# Patient Record
Sex: Male | Born: 1960 | Hispanic: Refuse to answer | Marital: Married | Smoking: Never smoker
Health system: Southern US, Community
[De-identification: ages and names within clinical notes are randomized; demographics above are authoritative.]

---

## 2012-07-27 ENCOUNTER — Ambulatory Visit (INDEPENDENT_AMBULATORY_CARE_PROVIDER_SITE_OTHER): Payer: BC Managed Care – PPO | Admitting: Physician Assistant

## 2012-07-27 VITALS — BP 144/86 | HR 80 | Temp 98.4°F | Resp 16 | Ht 69.5 in | Wt 212.2 lb

## 2012-07-27 DIAGNOSIS — J069 Acute upper respiratory infection, unspecified: Secondary | ICD-10-CM

## 2012-07-27 MED ORDER — GUAIFENESIN ER 1200 MG PO TB12: 1.0000 | ORAL_TABLET | Freq: Two times a day (BID) | ORAL | Status: DC | PRN

## 2012-07-27 MED ORDER — IPRATROPIUM BROMIDE 0.03 % NA SOLN: 2.0000 | Freq: Two times a day (BID) | NASAL | Status: DC

## 2012-07-27 NOTE — Patient Instructions (Addendum)
Get plenty of rest and drink at least 64 ounces of water daily. 

## 2012-07-27 NOTE — Progress Notes (Signed)
  Subjective:    Patient ID: Barnes Florek, male    DOB: 1960-09-28, 51 y.o.   MRN: [id number]  HPI This 51 y.o. male presents for evaluation of illness.  Sore throat began 8 days ago, with laryngitis.  Then developed cough, which is now gone.  Mild sneezing.  Today began to feel a little feverish.    History reviewed. No pertinent past medical history.  History reviewed. No pertinent past surgical history.  Prior to Admission medications   Medication Sig Start Date End Date Taking? Authorizing Provider  b complex vitamins tablet Take 1 tablet by mouth daily.   Yes Historical Provider, MD  Multiple Vitamin (MULTIVITAMIN) tablet Take 1 tablet by mouth daily.   Yes Historical Provider, MD  VITAMIN E PO Take 1 tablet by mouth daily.   Yes Historical Provider, MD    No Known Allergies  History   Social History  . Marital Status: Married    Spouse Name: N/A    Number of Children: 2  . Years of Education: N/A   Occupational History  . [employer]    Social History Main Topics  . Smoking status: Never Smoker   . Smokeless tobacco: Never Used  . Alcohol Use: No  . Drug Use: No  . Sexually Active: Yes -- Male partner(s)   Other Topics Concern  . Not on file   Social History Narrative   From [place]. Degree in Engineering for [employer].    Family History  Problem Relation Age of Onset  . Diabetes Mother   . Diabetes Father   . Diabetes Paternal Grandmother   . Diabetes Paternal Grandfather     Review of Systems     Objective:   Physical Exam        Assessment & Plan:

## 2012-07-27 NOTE — Progress Notes (Signed)
  Subjective:    Patient ID: Joe Frey, male    DOB: 1961-06-06, 51 y.o.   MRN: [id number]  HPI This 51 y.o. male presents for evaluation of illness.  Symptoms began 8 days ago with sorethroat and hoarse voice.  The sore throat resolved and he developed cough and runny nose.  No ear pain, HA, GI/GU symptoms.  No fever/chills.  He feels much better, and almost all his symptoms are resolved, just a little residual runny nose, but today developed diaphoresis x 2 episodes.     History reviewed. No pertinent past medical history.  History reviewed. No pertinent past surgical history.  Prior to Admission medications   Medication Sig Start Date End Date Taking? Authorizing Provider  b complex vitamins tablet Take 1 tablet by mouth daily.   Yes Historical Provider, MD  Guaifenesin (MUCINEX MAXIMUM STRENGTH) 1200 MG TB12 Take 1 tablet (1,200 mg total) by mouth every 12 (twelve) hours as needed. 07/27/12   Tomasa Dobransky S Jerrelle Michelsen, PA-C  ipratropium (ATROVENT) 0.03 % nasal spray Place 2 sprays into the nose 2 (two) times daily. 07/27/12   Michelle Vanhise Tessa Lerner, PA-C  Multiple Vitamin (MULTIVITAMIN) tablet Take 1 tablet by mouth daily.   Yes Historical Provider, MD  VITAMIN E PO Take 1 tablet by mouth daily.   Yes Historical Provider, MD    No Known Allergies  History   Social History  . Marital Status: Married    Spouse Name: N/A    Number of Children: 2  . Years of Education: N/A   Occupational History  . [employer]    Social History Main Topics  . Smoking status: Never Smoker   . Smokeless tobacco: Never Used  . Alcohol Use: No  . Drug Use: No  . Sexually Active: Yes -- Male partner(s)   Social History Narrative   From [place]. Degree in Engineering for [employer]. He is one of 13 siblings.    Family History  Problem Relation Age of Onset  . Diabetes Mother   . Diabetes Father   . Diabetes Paternal Grandmother   . Diabetes Paternal Grandfather     Review of  Systems As above.    Objective:   Physical Exam  Blood pressure 144/86, pulse 80, temperature 98.4 F (36.9 C), temperature source Oral, resp. rate 16, height 5' 9.5" (1.765 m), weight 212 lb 3.2 oz (96.253 kg). Body mass index is 30.89 kg/(m^2). Well-developed, well nourished [place] man who is awake, alert and oriented, in NAD. HEENT: [place]/AT, PERRL, EOMI.  Sclera and conjunctiva are clear.  EAC are patent, TMs are normal in appearance. Nasal mucosa is pink and moist. OP is clear. Neck: supple, non-tender, no lymphadenopathy, thyromegaly. Heart: RRR, no murmur Lungs: normal effort, CTA Extremities: no cyanosis, clubbing or edema. Skin: warm and dry without rash. Psychologic: good mood and appropriate affect, normal speech and behavior.     Assessment & Plan:   1. Acute upper respiratory infections of unspecified site  ipratropium (ATROVENT) 0.03 % nasal spray, Guaifenesin (MUCINEX MAXIMUM STRENGTH) 1200 MG TB12   Supportive care, anticipatory guidance.

## 2019-01-09 ENCOUNTER — Encounter (HOSPITAL_COMMUNITY): Payer: Self-pay | Admitting: Emergency Medicine

## 2019-01-09 ENCOUNTER — Emergency Department (HOSPITAL_COMMUNITY)
Admission: EM | Admit: 2019-01-09 | Discharge: 2019-01-09 | Disposition: A | Discharge status: Home / Self Care | Payer: BLUE CROSS/BLUE SHIELD | Attending: Emergency Medicine | Admitting: Emergency Medicine

## 2019-01-09 ENCOUNTER — Other Ambulatory Visit: Payer: Self-pay

## 2019-01-09 ENCOUNTER — Emergency Department (HOSPITAL_COMMUNITY): Payer: BLUE CROSS/BLUE SHIELD

## 2019-01-09 DIAGNOSIS — Z7982 Long term (current) use of aspirin: Secondary | ICD-10-CM | POA: Diagnosis not present

## 2019-01-09 DIAGNOSIS — R079 Chest pain, unspecified: Secondary | ICD-10-CM | POA: Diagnosis present

## 2019-01-09 LAB — BASIC METABOLIC PANEL
Anion gap: 13 (ref 5–15)
BUN: 15 mg/dL (ref 6–20)
CO2: 23 mmol/L (ref 22–32)
Calcium: 9.7 mg/dL (ref 8.9–10.3)
Chloride: 104 mmol/L (ref 98–111)
Creatinine, Ser: 0.94 mg/dL (ref 0.61–1.24)
GFR calc Af Amer: >60 mL/min (ref >60)
GFR calc non Af Amer: >60 mL/min (ref >60)
Glucose, Bld: 216 mg/dL — ABNORMAL HIGH (ref 70–99)
Potassium: 4.3 mmol/L (ref 3.5–5.1)
Sodium: 140 mmol/L (ref 135–145)

## 2019-01-09 LAB — CBC
HCT: 43.5 % (ref 39.0–52.0)
Hemoglobin: 14.2 g/dL (ref 13.0–17.0)
MCH: 28.3 pg (ref 26.0–34.0)
MCHC: 32.6 g/dL (ref 30.0–36.0)
MCV: 86.7 fL (ref 80.0–100.0)
Platelets: 267 10*3/uL (ref 150–400)
RBC: 5.02 MIL/uL (ref 4.22–5.81)
RDW: 12.8 % (ref 11.5–15.5)
WBC: 4.9 10*3/uL (ref 4.0–10.5)
nRBC: 0 % (ref 0.0–0.2)

## 2019-01-09 LAB — TROPONIN I
Troponin I: <0.03 ng/mL (ref <0.03)
Troponin I: <0.03 ng/mL (ref <0.03)

## 2019-01-09 MED ORDER — SODIUM CHLORIDE 0.9% FLUSH: 3.0000 mL | Freq: Once | INTRAVENOUS | Status: DC

## 2019-01-09 NOTE — ED Notes (Signed)
E-signature not available, verbalized understanding of DC instructions and follow up care 

## 2019-01-09 NOTE — ED Triage Notes (Signed)
Pt to ED with c/o chest discomfort and left arm numbness onset approx 2 hours ago.  Pt st's he took ASA 324mg  before coming to ED

## 2019-01-09 NOTE — ED Provider Notes (Signed)
[facility]   CSN: [id number] Arrival date & time: 01/09/19  1623    History   Chief Complaint Chief Complaint  Patient presents with  . Chest Pain    HPI Joe Frey is a 58 y.o. male.     HPI  58 year old male presents with chest pain.  Started around noon while he was at work at [employer].  It was never bad, he rates as about a 2 out of 10 in his left anterior chest.  States it kind of felt heavy and at some point he noticed some numbness going down his left arm into his hand.  Called his doctor who told him to take 325 mg aspirin.  Shortly after this he got better on his way to the ER.  He has no pain now.  Never had diaphoresis, shortness of breath, back pain, abdominal pain, vomiting.  Denies any medical problems including no hypertension, hyperlipidemia, diabetes, smoking history or family history of heart disease.  He feels fine at this time. Chest pain was not worse with exertion.  History reviewed. No pertinent past medical history.  There are no active problems to display for this patient.   History reviewed. No pertinent surgical history.      Home Medications    Prior to Admission medications   Medication Sig Start Date End Date Taking? Authorizing Provider  aspirin 325 MG EC tablet Take 650 mg by mouth once.   Yes [provider]    Family History Family History  Problem Relation Age of Onset  . Diabetes Mother   . Diabetes Father   . Diabetes Paternal Grandmother   . Diabetes Paternal Grandfather     Social History Social History   Tobacco Use  . Smoking status: Never Smoker  . Smokeless tobacco: Never Used  Substance Use Topics  . Alcohol use: No  . Drug use: No     Allergies   Patient has no known allergies.   Review of Systems Review of Systems  Constitutional: Negative for diaphoresis and fever.  Respiratory: Negative for shortness of breath.   Cardiovascular:  Positive for chest pain.  Gastrointestinal: Negative for abdominal pain and vomiting.  Musculoskeletal: Negative for back pain.  Neurological: Positive for numbness. Negative for weakness.  All other systems reviewed and are negative.    Physical Exam Updated Vital Signs BP 133/87   Pulse 63   Temp 97.8 F (36.6 C) (Oral)   Resp 13   Ht 6\' 1"  (1.854 m)   Wt 93.4 kg   SpO2 98%   BMI 27.18 kg/m   Physical Exam Vitals signs and nursing note reviewed.  Constitutional:      General: He is not in acute distress.    Appearance: He is well-developed. He is obese. He is not ill-appearing or diaphoretic.  HENT:     Head: Normocephalic and atraumatic.     Right Ear: External ear normal.     Left Ear: External ear normal.     Nose: Nose normal.  Eyes:     General:        Right eye: No discharge.        Left eye: No discharge.  Neck:     Musculoskeletal: Neck supple.  Cardiovascular:     Rate and Rhythm: Normal rate and regular rhythm.     Pulses:          Radial pulses are 2+ on the right side and  2+ on the left side.     Heart sounds: Normal heart sounds.  Pulmonary:     Effort: Pulmonary effort is normal.     Breath sounds: Normal breath sounds.  Abdominal:     Palpations: Abdomen is soft.     Tenderness: There is no abdominal tenderness.  Skin:    General: Skin is warm and dry.  Neurological:     Mental Status: He is alert.  Psychiatric:        Mood and Affect: Mood is not anxious.      ED Treatments / Results  Labs (all labs ordered are listed, but only abnormal results are displayed) Labs Reviewed  BASIC METABOLIC PANEL - Abnormal; Notable for the following components:      Result Value   Glucose, Bld 216 (*)    All other components within normal limits  CBC  TROPONIN I  TROPONIN I    EKG EKG Interpretation  Date/Time:  Wednesday Jan 09 2019 16:29:45 EDT Ventricular Rate:  70 PR Interval:  158 QRS Duration: 86 QT Interval:  372 QTC Calculation:  401 R Axis:   56 Text Interpretation:  Normal sinus rhythm no acute ST/T changes No old tracing to compare Confirmed by Pricilla Loveless [phone]) on 01/09/2019 7:37:16 PM   EKG Interpretation  Date/Time:  Wednesday Jan 09 2019 20:21:41 EDT Ventricular Rate:  63 PR Interval:  158 QRS Duration: 100 QT Interval:  379 QTC Calculation: 388 R Axis:   50 Text Interpretation:  Sinus rhythm Probable anteroseptal infarct, old no significant change since earlier in the day Confirmed by Pricilla Loveless [phone]) on 01/09/2019 8:30:18 PM        Radiology Dg Chest 2 View  Result Date: 01/09/2019 CLINICAL DATA:  Chest pain EXAM: CHEST - 2 VIEW COMPARISON:  None. FINDINGS: The heart size and mediastinal contours are within normal limits. Both lungs are clear. The visualized skeletal structures are unremarkable. IMPRESSION: No active cardiopulmonary disease. Electronically Signed   By: Katherine Mantle M.D.   On: 01/09/2019 17:21    Procedures Procedures (including critical care time)  Medications Ordered in ED Medications  sodium chloride flush (NS) 0.9 % injection 3 mL (has no administration in time range)     Initial Impression / Assessment and Plan / ED Course  I have reviewed the triage vital signs and the nursing notes.  Pertinent labs & imaging results that were available during my care of the patient were reviewed by me and considered in my medical decision making (see chart for details).        Patient feels well here with no current pain.  Patient has an atypical history and with no significant risk factors besides some obesity, he appears stable for outpatient work-up is a low risk cardiac case.  I have encouraged him to follow-up closely with his PCP.  Doubt unstable angina.  Highly doubt PE or dissection.  Recommend starting baby aspirin and follow-up with PCP.  We discussed return precautions.  Final Clinical Impressions(s) / ED Diagnoses   Final diagnoses:  Nonspecific chest  pain    ED Discharge Orders    None       Pricilla Loveless, MD 01/09/19 2313

## 2019-01-09 NOTE — Discharge Instructions (Signed)
If you develop recurrent, continued, or worsening chest pain, shortness of breath, fever, vomiting, abdominal or back pain, or any other new/concerning symptoms then return to the ER for evaluation.  

## 2019-12-02 ENCOUNTER — Ambulatory Visit: Payer: BC Managed Care – PPO | Attending: Internal Medicine

## 2019-12-02 DIAGNOSIS — Z23 Encounter for immunization: Secondary | ICD-10-CM

## 2019-12-02 NOTE — Progress Notes (Signed)
   Covid-19 Vaccination Clinic  Name:  Joe Frey    MRN: [id number] DOB: [birth year]  12/02/2019  [name] was observed post Covid-19 immunization for 15 minutes without incident. He was provided with Vaccine Information Sheet and instruction to access the V-Safe system.   [name] was instructed to call 911 with any severe reactions post vaccine: Marland Kitchen Difficulty breathing  . Swelling of face and throat  . A fast heartbeat  . A bad rash all over body  . Dizziness and weakness   Immunizations Administered    Name Date Dose VIS Date Route   Pfizer COVID-19 Vaccine 12/02/2019 11:28 AM 0.3 mL 08/02/2019 Intramuscular   Manufacturer: [employer], [employer]   Lot: [id number]   NDC: [address]-1

## 2019-12-26 ENCOUNTER — Ambulatory Visit: Payer: BC Managed Care – PPO | Attending: Internal Medicine

## 2019-12-26 DIAGNOSIS — Z23 Encounter for immunization: Secondary | ICD-10-CM

## 2019-12-26 NOTE — Progress Notes (Signed)
   Covid-19 Vaccination Clinic  Name:  Fredric Slabach    MRN: [id number] DOB: 25-Sep-1960  12/26/2019  [name] was observed post Covid-19 immunization for 15 minutes without incident. He was provided with Vaccine Information Sheet and instruction to access the V-Safe system.   [name] was instructed to call 911 with any severe reactions post vaccine: Marland Kitchen Difficulty breathing  . Swelling of face and throat  . A fast heartbeat  . A bad rash all over body  . Dizziness and weakness   Immunizations Administered    Name Date Dose VIS Date Route   Pfizer COVID-19 Vaccine 12/26/2019  9:43 AM 0.3 mL 10/16/2018 Intramuscular   Manufacturer: [employer], [employer]   Lot: [id number]   NDC: [address]-2

## 2019-12-30 ENCOUNTER — Ambulatory Visit: Payer: BC Managed Care – PPO

## 2020-08-24 ENCOUNTER — Ambulatory Visit: Payer: BC Managed Care – PPO

## 2020-08-24 DIAGNOSIS — Z23 Encounter for immunization: Secondary | ICD-10-CM

## 2020-10-05 IMAGING — DX CHEST - 2 VIEW
2 series · 2 of 2 positions shown · non-contrast
Comparison: None.

CLINICAL DATA: Chest pain

EXAM:
CHEST - 2 VIEW

[chest pa]
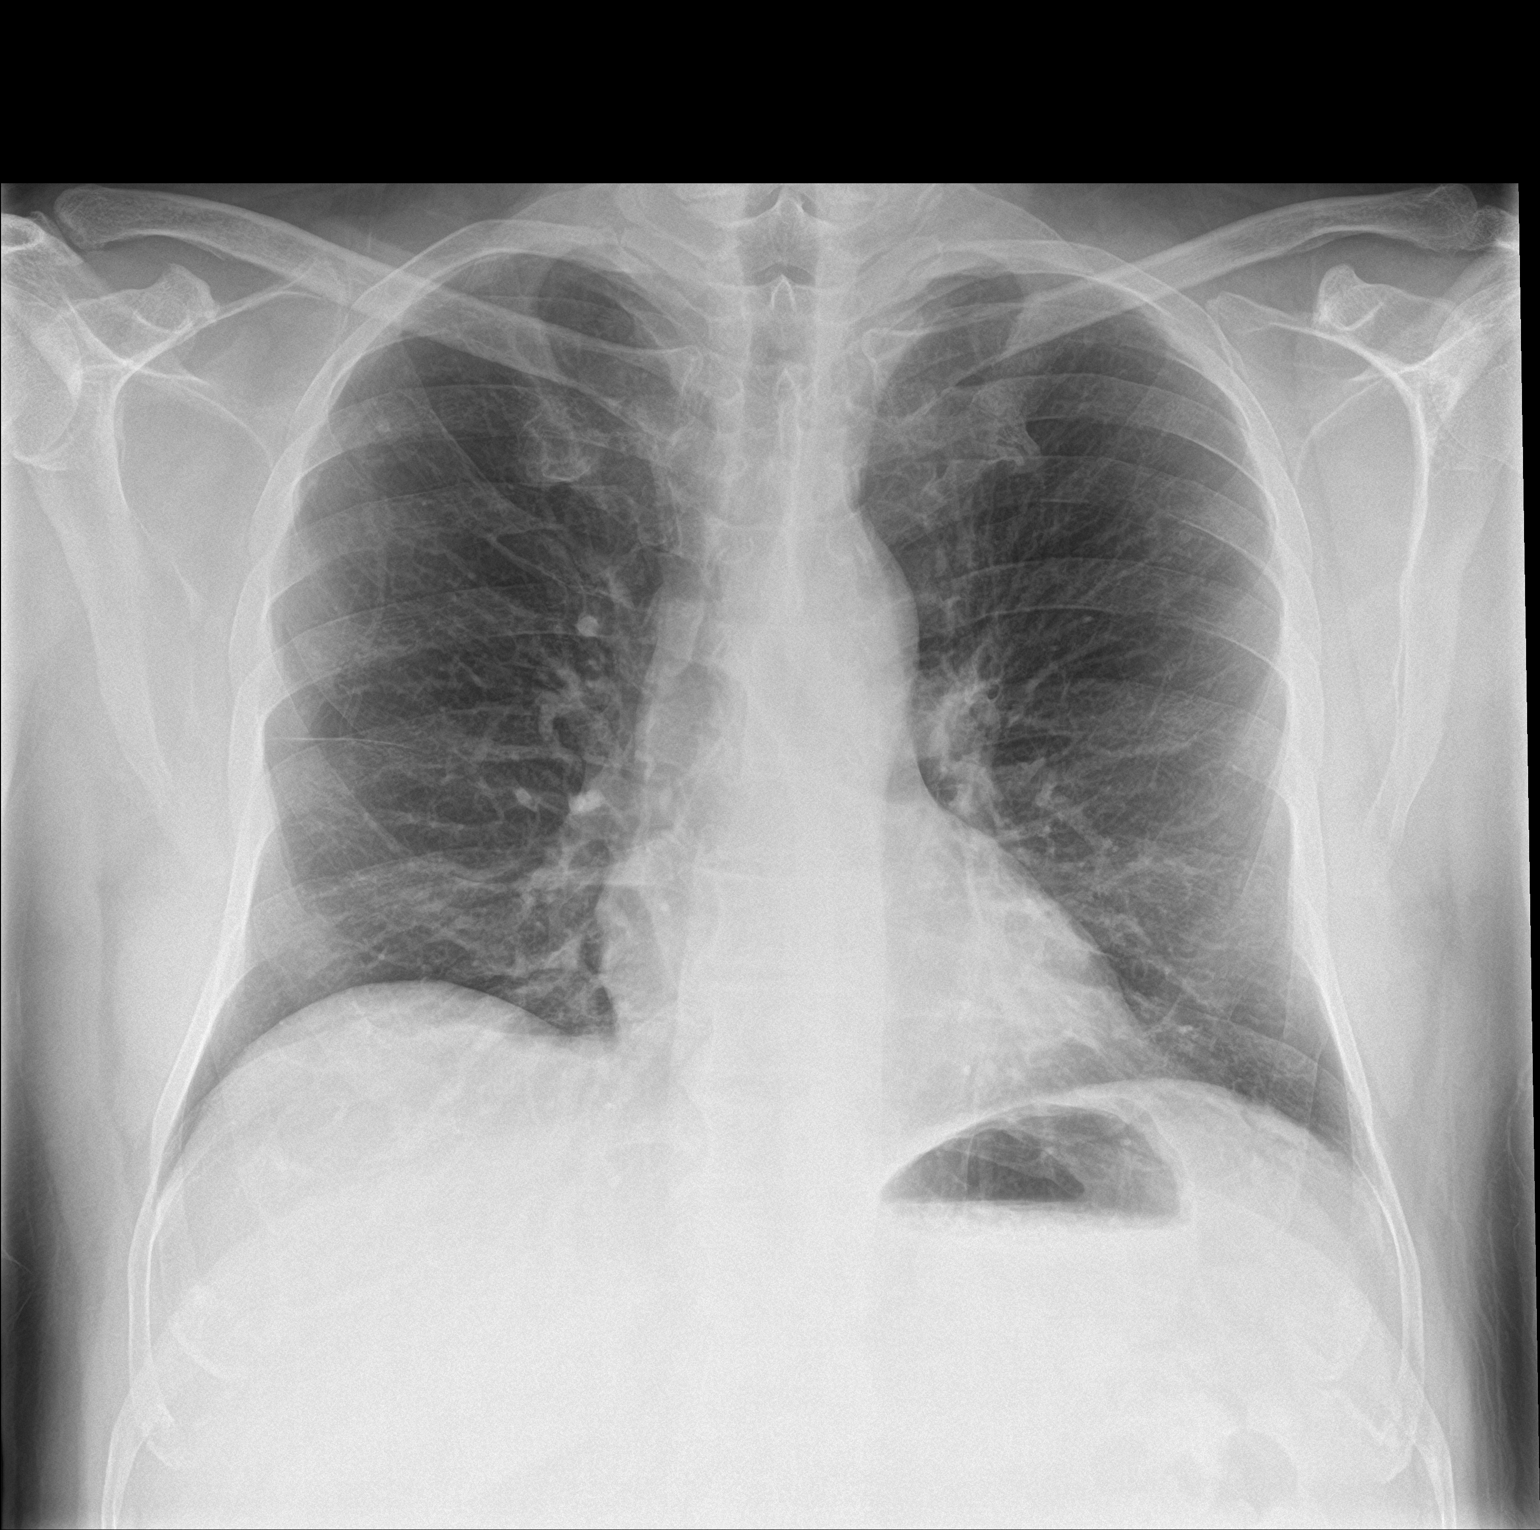

[chest lat]
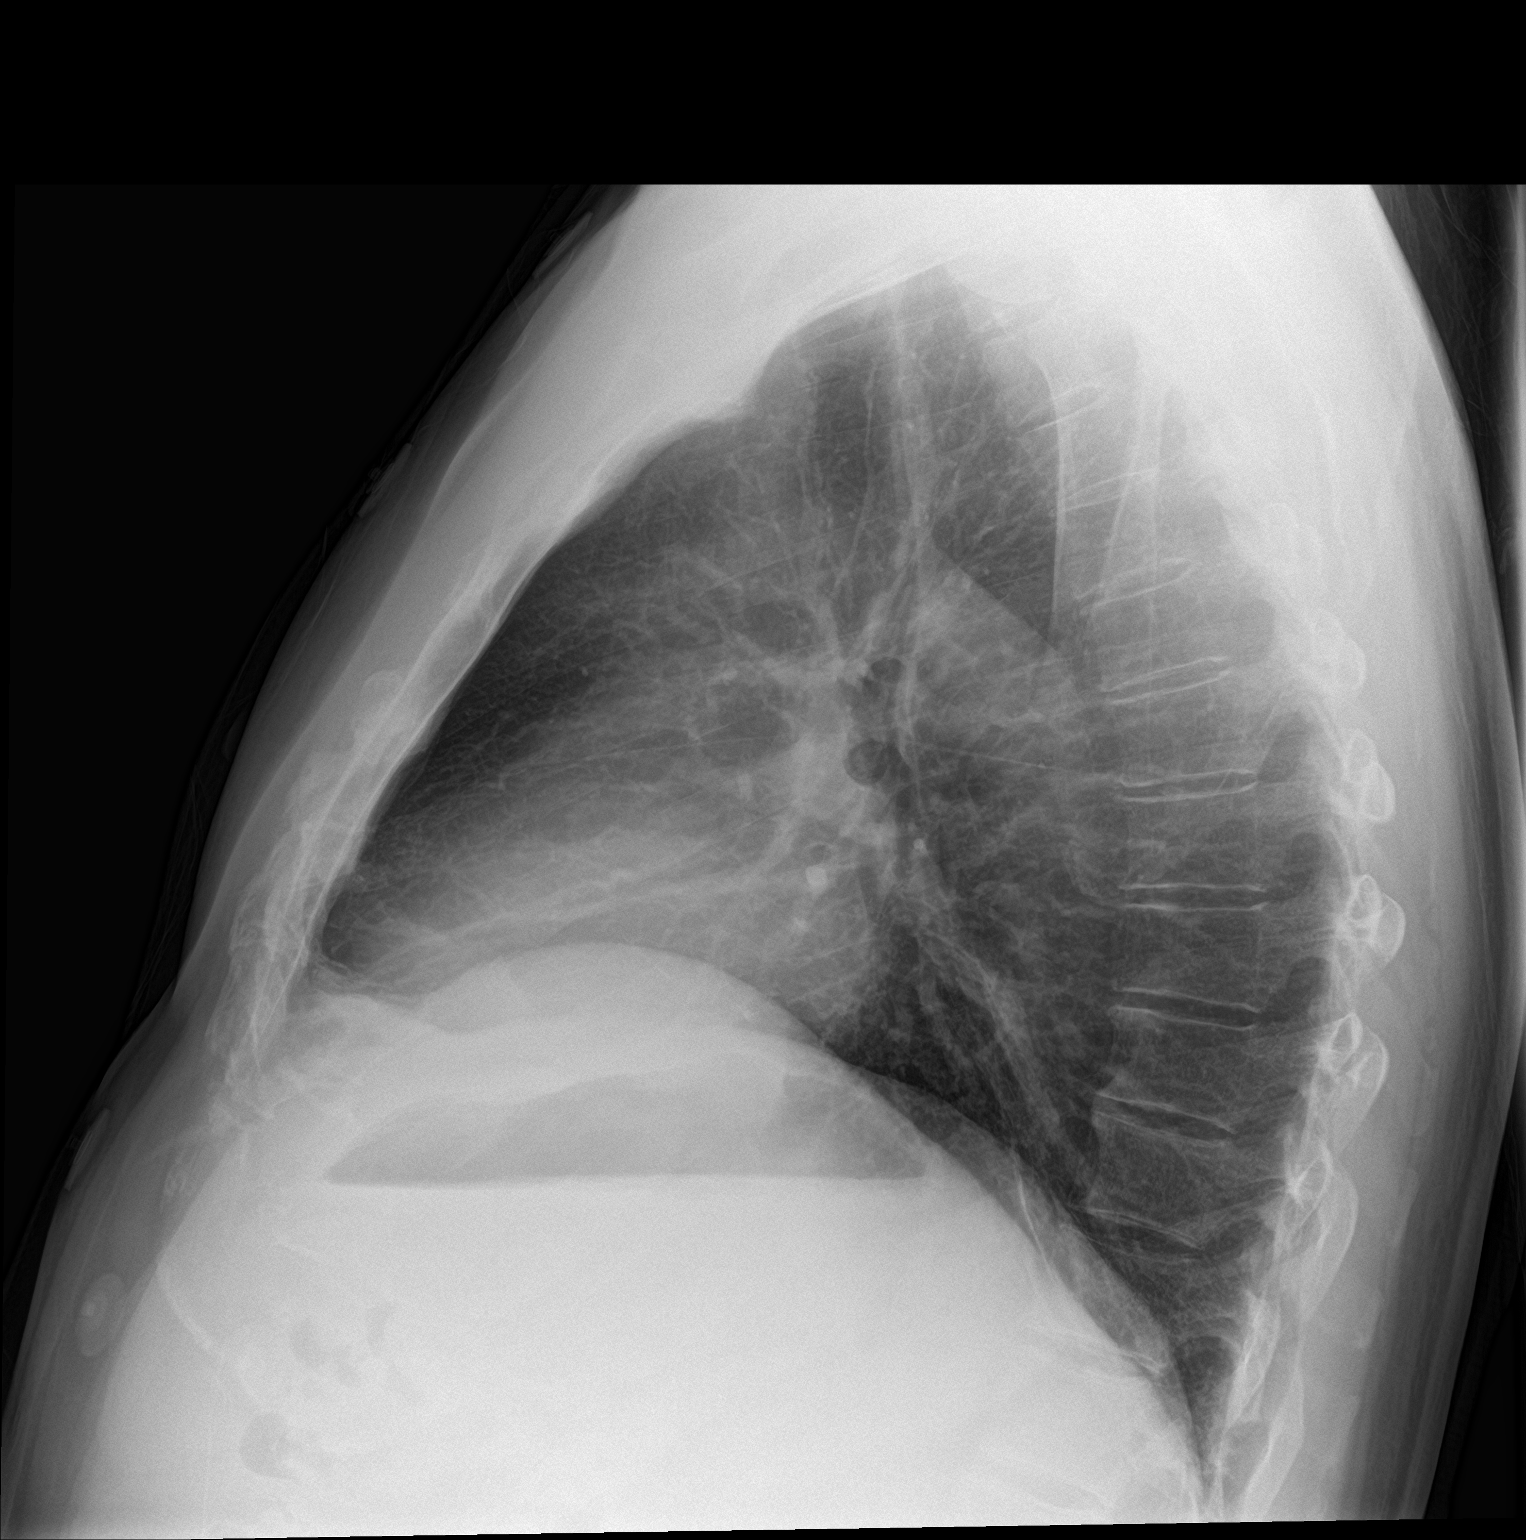

[2 of 2 positions shown; findings below may reference images not displayed]

FINDINGS: The heart size and mediastinal contours are within normal limits.
Both lungs are clear. The visualized skeletal structures are
unremarkable.
IMPRESSION: No active cardiopulmonary disease.

## 2022-05-20 ENCOUNTER — Telehealth: Payer: Self-pay | Admitting: Family Medicine

## 2022-05-20 NOTE — Telephone Encounter (Signed)
Pt calling b/c would like to establish care with Dr. Carlota Raspberry as his family are all pt's here; wife and daughter see Dr. Birdie Riddle, son is a Dr. Carlota Raspberry pt; states that his long time PCP is retiring.

## 2022-05-20 NOTE — Telephone Encounter (Signed)
Yes I will see him

## 2022-05-23 NOTE — Telephone Encounter (Signed)
Okay to schedule

## 2022-05-23 NOTE — Telephone Encounter (Signed)
Spoke with pt. Pt is aware that he can establish care with Dr.Greene.  pt will call back at a later to schedule his appt

## 2023-09-05 ENCOUNTER — Emergency Department (HOSPITAL_BASED_OUTPATIENT_CLINIC_OR_DEPARTMENT_OTHER): Payer: BC Managed Care – PPO

## 2023-09-05 ENCOUNTER — Emergency Department (HOSPITAL_BASED_OUTPATIENT_CLINIC_OR_DEPARTMENT_OTHER)
Admission: EM | Admit: 2023-09-05 | Discharge: 2023-09-05 | Disposition: A | Discharge status: Home / Self Care | Payer: BC Managed Care – PPO | Attending: Emergency Medicine | Admitting: Emergency Medicine

## 2023-09-05 ENCOUNTER — Other Ambulatory Visit: Payer: Self-pay

## 2023-09-05 ENCOUNTER — Encounter (HOSPITAL_BASED_OUTPATIENT_CLINIC_OR_DEPARTMENT_OTHER): Payer: Self-pay

## 2023-09-05 DIAGNOSIS — R200 Anesthesia of skin: Secondary | ICD-10-CM | POA: Insufficient documentation

## 2023-09-05 DIAGNOSIS — Z7982 Long term (current) use of aspirin: Secondary | ICD-10-CM | POA: Diagnosis not present

## 2023-09-05 DIAGNOSIS — R0789 Other chest pain: Secondary | ICD-10-CM | POA: Insufficient documentation

## 2023-09-05 LAB — DIFFERENTIAL
Abs Immature Granulocytes: 0.03 10*3/uL (ref 0.00–0.07)
Basophils Absolute: 0 10*3/uL (ref 0.0–0.1)
Basophils Relative: 1 %
Eosinophils Absolute: 0.2 10*3/uL (ref 0.0–0.5)
Eosinophils Relative: 3 %
Immature Granulocytes: 1 %
Lymphocytes Relative: 29 %
Lymphs Abs: 1.6 10*3/uL (ref 0.7–4.0)
Monocytes Absolute: 0.5 10*3/uL (ref 0.1–1.0)
Monocytes Relative: 9 %
Neutro Abs: 3.3 10*3/uL (ref 1.7–7.7)
Neutrophils Relative %: 57 %

## 2023-09-05 LAB — COMPREHENSIVE METABOLIC PANEL
ALT: 23 U/L (ref 0–44)
AST: 22 U/L (ref 15–41)
Albumin: 4.1 g/dL (ref 3.5–5.0)
Alkaline Phosphatase: 50 U/L (ref 38–126)
Anion gap: 5 (ref 5–15)
BUN: 16 mg/dL (ref 8–23)
CO2: 28 mmol/L (ref 22–32)
Calcium: 9.1 mg/dL (ref 8.9–10.3)
Chloride: 103 mmol/L (ref 98–111)
Creatinine, Ser: 0.88 mg/dL (ref 0.61–1.24)
GFR, Estimated: >60 mL/min (ref >60)
Glucose, Bld: 141 mg/dL — ABNORMAL HIGH (ref 70–99)
Potassium: 4 mmol/L (ref 3.5–5.1)
Sodium: 136 mmol/L (ref 135–145)
Total Bilirubin: 1.3 mg/dL — ABNORMAL HIGH (ref 0.0–1.2)
Total Protein: 7.8 g/dL (ref 6.5–8.1)

## 2023-09-05 LAB — CBC
HCT: 41.4 % (ref 39.0–52.0)
Hemoglobin: 13.8 g/dL (ref 13.0–17.0)
MCH: 27.8 pg (ref 26.0–34.0)
MCHC: 33.3 g/dL (ref 30.0–36.0)
MCV: 83.3 fL (ref 80.0–100.0)
Platelets: 237 10*3/uL (ref 150–400)
RBC: 4.97 MIL/uL (ref 4.22–5.81)
RDW: 13.3 % (ref 11.5–15.5)
WBC: 5.6 10*3/uL (ref 4.0–10.5)
nRBC: 0 % (ref 0.0–0.2)

## 2023-09-05 LAB — APTT: aPTT: 25 s (ref 24–36)

## 2023-09-05 LAB — TROPONIN I (HIGH SENSITIVITY): Troponin I (High Sensitivity): 4 ng/L (ref <18)

## 2023-09-05 LAB — PROTIME-INR
INR: 0.9 (ref 0.8–1.2)
Prothrombin Time: 12.4 s (ref 11.4–15.2)

## 2023-09-05 LAB — ETHANOL: Alcohol, Ethyl (B): <10 mg/dL (ref <10)

## 2023-09-05 NOTE — ED Provider Notes (Signed)
 [place] [facility]  Provider Note  CSN: [id number] Arrival date & time: 09/05/23 1658  History Chief Complaint  Patient presents with   Numbness    Joe Frey is a 63 y.o. male with history of pre-diabetes reports 3-4 weeks of occasional R sided chest discomfort, not associated with SOB, no exertional component. He has had several days of R arm heaviness, he described it as a numbness, but denies any true loss of sensation or tingling. No weakness. He saw his PCP about a month ago for similar. Has been taking ASA.    Home Medications Prior to Admission medications   Medication Sig Start Date End Date Taking? Authorizing Provider  aspirin 325 MG EC tablet Take 650 mg by mouth once.    [provider]     Allergies    Simvastatin   Review of Systems   Review of Systems Please see HPI for pertinent positives and negatives  Physical Exam BP 125/82   Pulse 74   Temp 98 F (36.7 C)   Resp 13   Ht 6' 1 (1.854 m)   Wt 87.5 kg   SpO2 100%   BMI 25.46 kg/m   Physical Exam Vitals and nursing note reviewed.  Constitutional:      Appearance: Normal appearance.  HENT:     Head: Normocephalic and atraumatic.     Nose: Nose normal.     Mouth/Throat:     Mouth: Mucous membranes are moist.  Eyes:     Extraocular Movements: Extraocular movements intact.     Conjunctiva/sclera: Conjunctivae normal.  Cardiovascular:     Rate and Rhythm: Normal rate.  Pulmonary:     Effort: Pulmonary effort is normal.     Breath sounds: Normal breath sounds.  Abdominal:     General: Abdomen is flat.     Palpations: Abdomen is soft.     Tenderness: There is no abdominal tenderness.  Musculoskeletal:        General: No swelling. Normal range of motion.     Cervical back: Neck supple.  Skin:    General: Skin is warm and dry.  Neurological:     General: No focal deficit present.     Mental Status: He is alert and oriented to person,  place, and time.     Cranial Nerves: No cranial nerve deficit.     Sensory: No sensory deficit.     Motor: No weakness.  Psychiatric:        Mood and Affect: Mood normal.     ED Results / Procedures / Treatments   EKG EKG Interpretation Date/Time:  Tuesday September 05 2023 17:07:28 EST Ventricular Rate:  79 PR Interval:  162 QRS Duration:  82 QT Interval:  352 QTC Calculation: 403 R Axis:   43  Text Interpretation: Normal sinus rhythm Cannot rule out Anterior infarct , age undetermined Abnormal ECG When compared with ECG of 09-Jan-2019 20:21, No significant change since last tracing Confirmed by Roselyn Dunnings [phone]) on 09/05/2023 10:58:57 PM  Procedures Procedures  Medications Ordered in the ED Medications - No data to display  Initial Impression and Plan  Patient here with chest discomfort off and on for several weeks, atypical for angina. Also having some R arm heaviness, described as numbness but no focal neuro deficit to suggest CVA. Labs done in triage show normal CBC, CMP, EtOH, coags, and Trop (Stroke Triage order set). I personally viewed the images from radiology studies and agree  with radiologist interpretation: CXR and CT head are normal. Given duration of symptoms, repeat Trop is not indicated to rule out AMI. He has a HEART score of 1, low risk for MACE. Recommend he follow up with PCP, referral to Cardiology, continue ASA, RTED for any worsening symptoms or other concerns.   ED Course       MDM Rules/Calculators/A&P Medical Decision Making Given presenting complaint, I considered that admission might be necessary. After review of results from ED lab and/or imaging studies, admission to the hospital is not indicated at this time.    Problems Addressed: Atypical chest pain: acute illness or injury  Amount and/or Complexity of Data Reviewed Labs: ordered. Decision-making details documented in ED Course. Radiology: ordered and independent interpretation  performed. Decision-making details documented in ED Course. ECG/medicine tests: ordered and independent interpretation performed. Decision-making details documented in ED Course.  Risk Decision regarding hospitalization.     Final Clinical Impression(s) / ED Diagnoses Final diagnoses:  Atypical chest pain    Rx / DC Orders ED Discharge Orders          Ordered    Ambulatory referral to Cardiology       Comments: If you have not heard from the Cardiology office within the next 72 hours please call [phone].   09/05/23 2313             Roselyn Carlin NOVAK, MD 09/05/23 [phone]

## 2023-09-05 NOTE — ED Triage Notes (Signed)
 C/o chest pain intermittent x 3-4 weeks. Right arm numbness intermittently x 2 weeks, states arm "feels different" than his left. States numbness in arm became worse today. Denies chest pain at arrival.   Stroke screen negative in triage
